# Patient Record
Sex: Male | Born: 1970 | Race: White | Hispanic: No | Marital: Single | Smoking: Never smoker
Health system: Southern US, Community
[De-identification: ages and names within clinical notes are randomized; demographics above are authoritative.]

---

## 2004-09-25 ENCOUNTER — Emergency Department: Payer: Self-pay | Admitting: Emergency Medicine

## 2007-09-24 ENCOUNTER — Emergency Department: Payer: Self-pay | Admitting: Emergency Medicine

## 2008-09-23 ENCOUNTER — Emergency Department: Payer: Self-pay | Admitting: Unknown Physician Specialty

## 2008-11-14 ENCOUNTER — Emergency Department: Payer: Self-pay | Admitting: Internal Medicine

## 2009-12-13 ENCOUNTER — Emergency Department (HOSPITAL_COMMUNITY): Admission: EM | Admit: 2009-12-13 | Discharge: 2009-12-13 | Payer: Self-pay | Admitting: Emergency Medicine

## 2010-01-20 ENCOUNTER — Emergency Department: Payer: Self-pay | Admitting: Emergency Medicine

## 2012-08-05 IMAGING — CT CT MAXILLOFACIAL W/O CM
3 series · 16 of 47 positions shown, 19 images · non-contrast
Comparison: None.

CLINICAL DATA: Sinus pressure and headache.

CT MAXILLOFACIAL WITHOUT CONTRAST
TECHNIQUE: Multidetector CT imaging of the maxillofacial
structures was performed. Multiplanar CT image reconstructions were
also generated.

[Series 2: facial bones · axial · 0.35mm/px · z∈[+118,+264]mm · 10 of 85 slices shown, 13 images]
[im 6/85  brain]
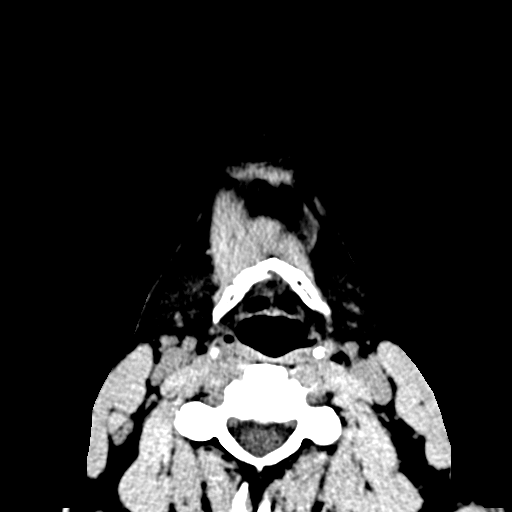
[im 6/85  bone]
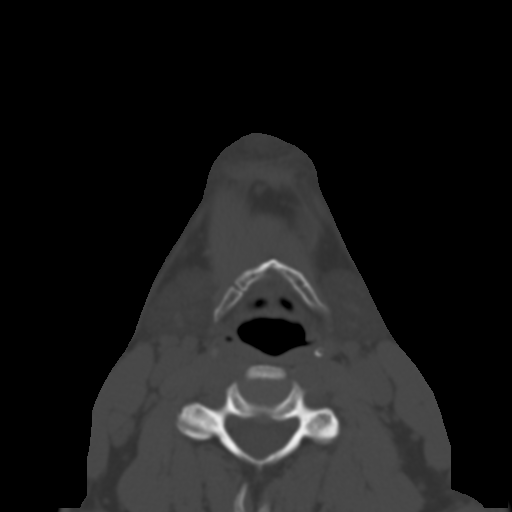
[im 15/85  bone]
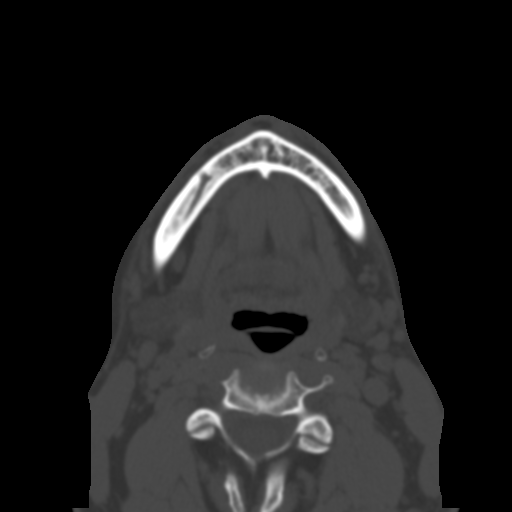
[im 24/85  bone]
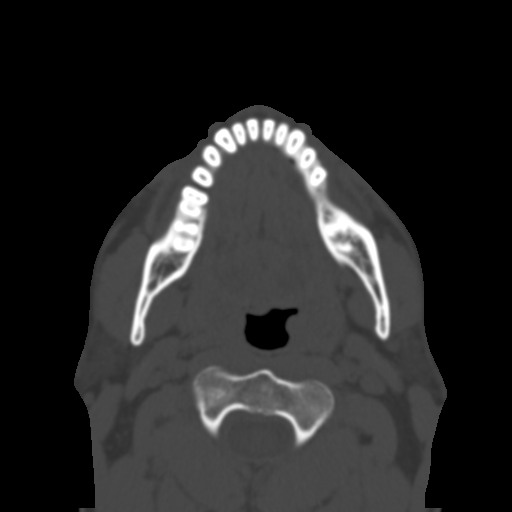
[im 29/85  bone]
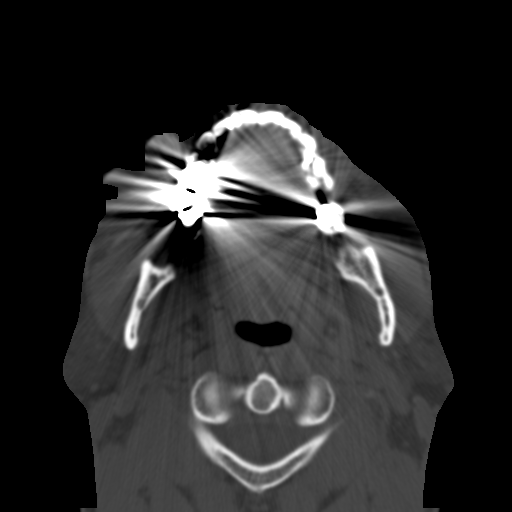
[im 38/85  brain]
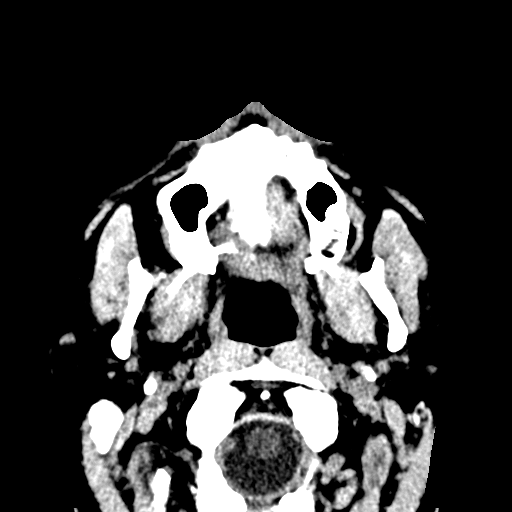
[im 38/85  bone]
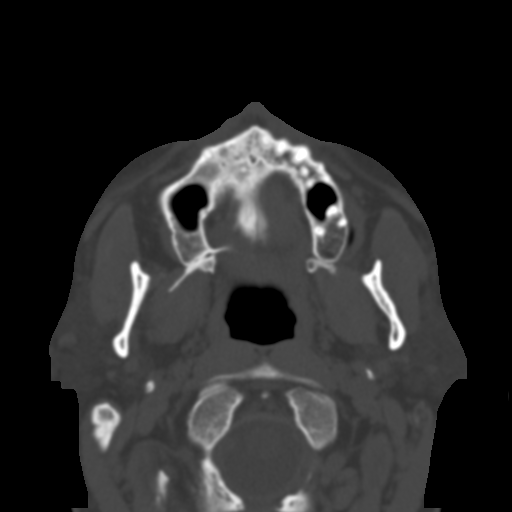
[im 47/85  bone]
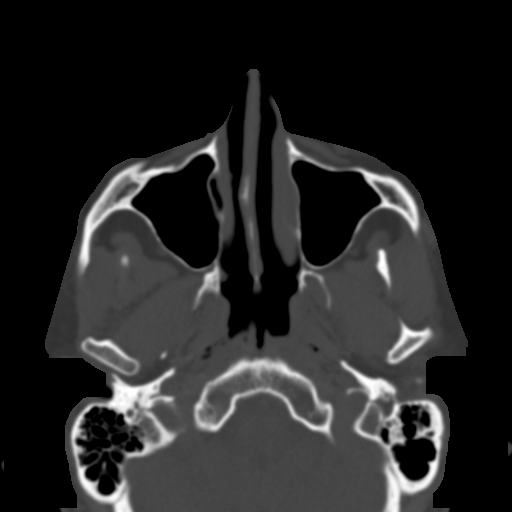
[im 56/85  bone]
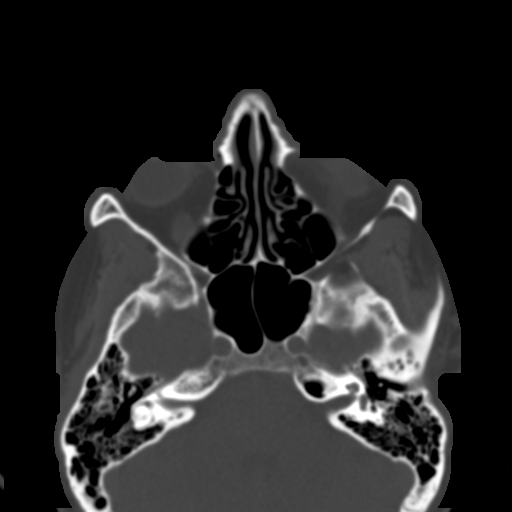
[im 64/85  bone]
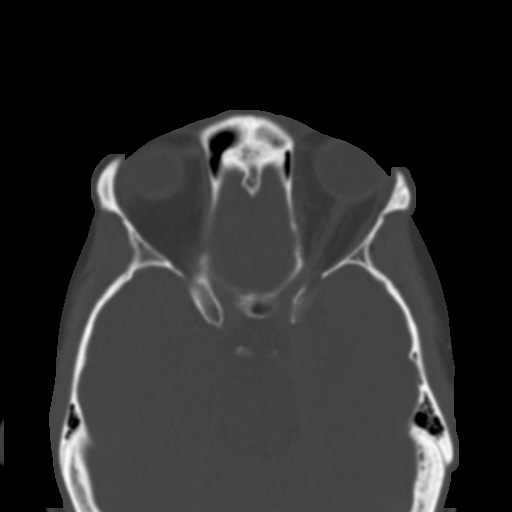
[im 70/85  brain]
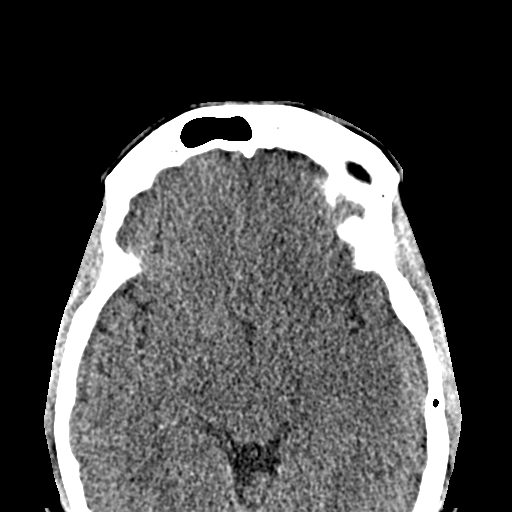
[im 70/85  bone]
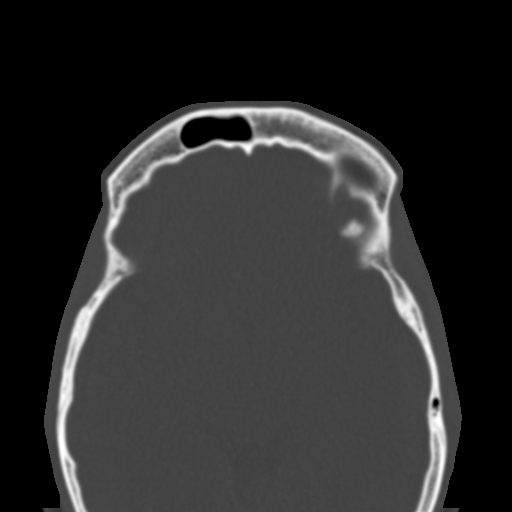
[im 79/85  bone]
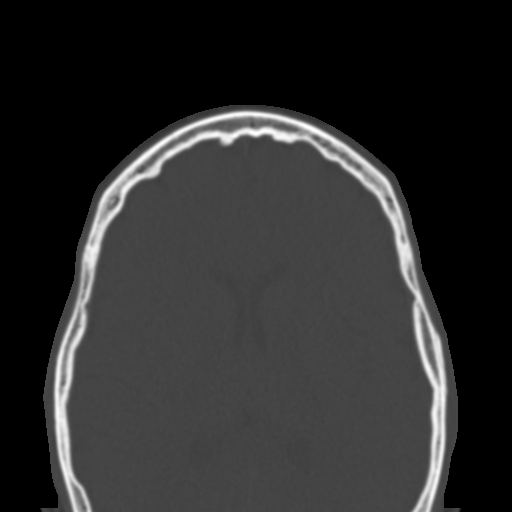

[Series 8048: mpr, coronal std, coronal · coronal · 0.35mm/px · 3 of 74 slices shown]
[im 25/74  bone]
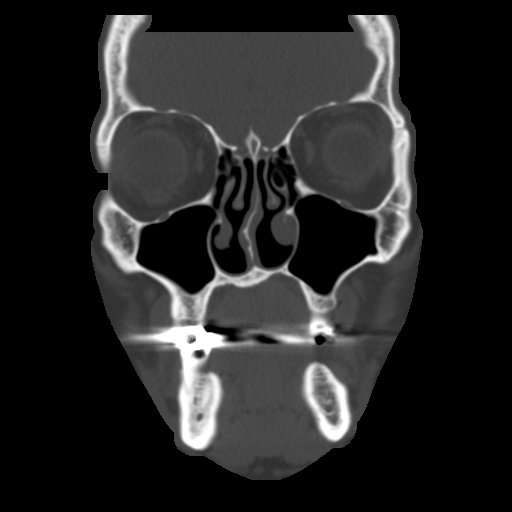
[im 33/74  bone]
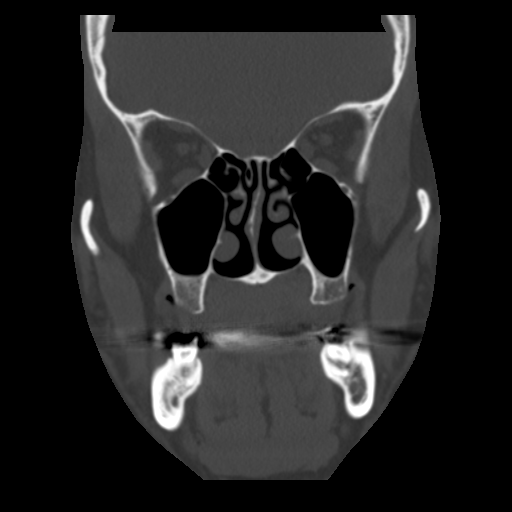
[im 41/74  bone]
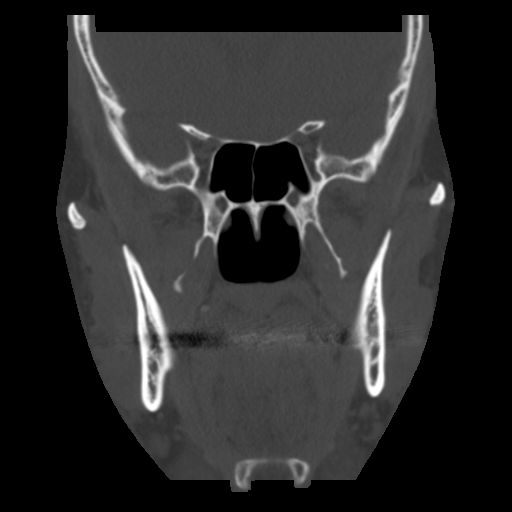

[Series 8049: mpr, sagittal std, sagittal · sagittal · 0.35mm/px · 3 of 73 slices shown]
[im 25/73  bone]
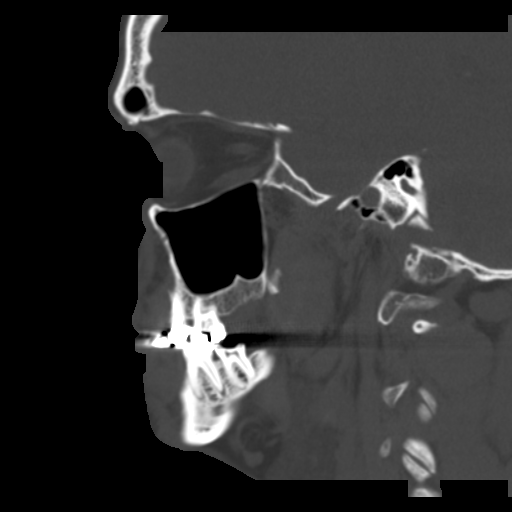
[im 37/73  bone]
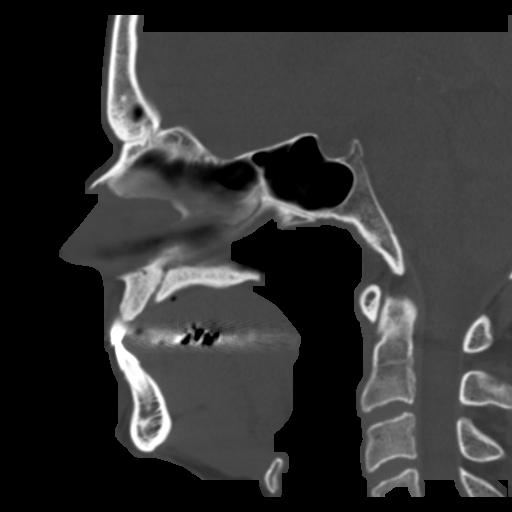
[im 49/73  bone]
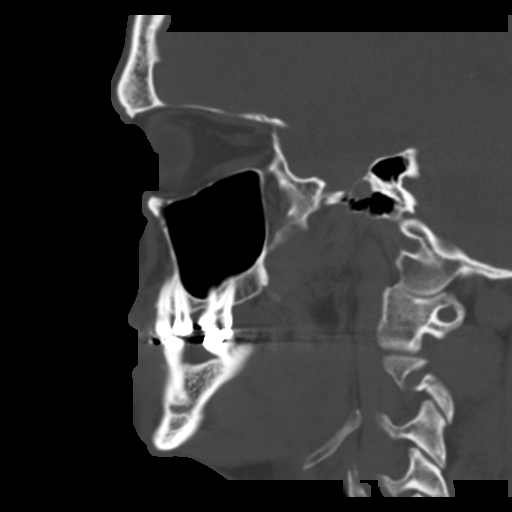

[16 of 47 positions shown; findings below may reference images not displayed]

FINDINGS: Normal appearance of the globes and orbits.  No gross
abnormality to the upper neck soft tissues.  There is no
significant upper neck lymphadenopathy.  Normal appearance of the
visualized intracranial structures.  There is a tiny amount of
fluid in the dependent right mastoid air cells.  The pterygoid
plates are intact.  Both mandibular condyles are located.  There is
periapical lucency surrounding the right upper lateral incisor.
There also lucency along the anterior aspect of the right upper
first bicuspid. Patient has multiple dental fillings.  The
paranasal sinuses are clear without any significant mucosal
thickening.  Negative for acute fracture.
IMPRESSION: Periapical lucencies involving the right upper lateral incisor and
right upper first bicuspid.  Findings are concerning for periapical
abscesses.

Normal appearance of the paranasal sinuses.

## 2016-01-28 ENCOUNTER — Emergency Department
Admission: EM | Admit: 2016-01-28 | Discharge: 2016-01-28 | Disposition: A | Discharge status: Home / Self Care | Payer: Self-pay | Attending: Emergency Medicine | Admitting: Emergency Medicine

## 2016-01-28 ENCOUNTER — Emergency Department: Payer: Self-pay

## 2016-01-28 ENCOUNTER — Encounter: Payer: Self-pay | Admitting: *Deleted

## 2016-01-28 DIAGNOSIS — N23 Unspecified renal colic: Secondary | ICD-10-CM | POA: Insufficient documentation

## 2016-01-28 DIAGNOSIS — R1031 Right lower quadrant pain: Secondary | ICD-10-CM

## 2016-01-28 LAB — URINALYSIS COMPLETE WITH MICROSCOPIC (ARMC ONLY)
BACTERIA UA: NONE SEEN
Bilirubin Urine: NEGATIVE
GLUCOSE, UA: NEGATIVE mg/dL
Ketones, ur: NEGATIVE mg/dL
Nitrite: NEGATIVE
PROTEIN: NEGATIVE mg/dL
SQUAMOUS EPITHELIAL / LPF: NONE SEEN
Specific Gravity, Urine: 1.017 (ref 1.005–1.030)
pH: 5 (ref 5.0–8.0)

## 2016-01-28 LAB — CBC WITH DIFFERENTIAL/PLATELET
BASOS ABS: 0.1 10*3/uL (ref 0–0.1)
BASOS PCT: 1 %
Eosinophils Absolute: 0.6 10*3/uL (ref 0–0.7)
Eosinophils Relative: 7 %
HEMATOCRIT: 46.7 % (ref 40.0–52.0)
HEMOGLOBIN: 15.9 g/dL (ref 13.0–18.0)
Lymphocytes Relative: 33 %
Lymphs Abs: 2.9 10*3/uL (ref 1.0–3.6)
MCH: 30.2 pg (ref 26.0–34.0)
MCHC: 34.1 g/dL (ref 32.0–36.0)
MCV: 88.8 fL (ref 80.0–100.0)
MONOS PCT: 7 %
Monocytes Absolute: 0.6 10*3/uL (ref 0.2–1.0)
NEUTROS ABS: 4.5 10*3/uL (ref 1.4–6.5)
NEUTROS PCT: 52 %
Platelets: 367 10*3/uL (ref 150–440)
RBC: 5.26 MIL/uL (ref 4.40–5.90)
RDW: 12.7 % (ref 11.5–14.5)
WBC: 8.7 10*3/uL (ref 3.8–10.6)

## 2016-01-28 LAB — COMPREHENSIVE METABOLIC PANEL
ALBUMIN: 4.6 g/dL (ref 3.5–5.0)
ALK PHOS: 61 U/L (ref 38–126)
ALT: 25 U/L (ref 17–63)
AST: 23 U/L (ref 15–41)
Anion gap: 9 (ref 5–15)
BILIRUBIN TOTAL: 0.4 mg/dL (ref 0.3–1.2)
BUN: 15 mg/dL (ref 6–20)
CALCIUM: 9.6 mg/dL (ref 8.9–10.3)
CO2: 26 mmol/L (ref 22–32)
CREATININE: 1.18 mg/dL (ref 0.61–1.24)
Chloride: 104 mmol/L (ref 101–111)
GFR calc Af Amer: >60 mL/min (ref >60)
GFR calc non Af Amer: >60 mL/min (ref >60)
GLUCOSE: 122 mg/dL — AB (ref 65–99)
Potassium: 3.9 mmol/L (ref 3.5–5.1)
Sodium: 139 mmol/L (ref 135–145)
TOTAL PROTEIN: 8 g/dL (ref 6.5–8.1)

## 2016-01-28 LAB — LIPASE, BLOOD: Lipase: 26 U/L (ref 11–51)

## 2016-01-28 MED ORDER — HYDROMORPHONE HCL 1 MG/ML IJ SOLN
1.0000 mg | Freq: Once | INTRAMUSCULAR | Status: AC
  Administered 2016-01-28: 1 mg via INTRAVENOUS
  Filled 2016-01-28: qty 1

## 2016-01-28 MED ORDER — ONDANSETRON HCL 4 MG PO TABS: 4.0000 mg | ORAL_TABLET | Freq: Three times a day (TID) | ORAL | 0 refills | Status: DC | PRN

## 2016-01-28 MED ORDER — SODIUM CHLORIDE 0.9 % IV BOLUS (SEPSIS)
500.0000 mL | Freq: Once | INTRAVENOUS | Status: AC
  Administered 2016-01-28: 500 mL via INTRAVENOUS

## 2016-01-28 MED ORDER — ONDANSETRON HCL 4 MG/2ML IJ SOLN
4.0000 mg | Freq: Once | INTRAMUSCULAR | Status: AC
  Administered 2016-01-28: 4 mg via INTRAVENOUS
  Filled 2016-01-28: qty 2

## 2016-01-28 MED ORDER — KETOROLAC TROMETHAMINE 30 MG/ML IJ SOLN
30.0000 mg | Freq: Once | INTRAMUSCULAR | Status: AC
  Administered 2016-01-28: 30 mg via INTRAVENOUS
  Filled 2016-01-28: qty 1

## 2016-01-28 NOTE — ED Notes (Signed)
Pt transported to CT ?

## 2016-01-28 NOTE — ED Triage Notes (Signed)
Patient c/o RLQ approximately 1.5 hours prior to arrival. Patient states pain is sharp, at times radiates to right flank. Patient states pain intermittently increases in intensity. Patient got out of the wheelchair in the lobby and laid on the floor, but was able to get back in the wheelchair independently.

## 2016-01-28 NOTE — Discharge Instructions (Signed)
Please return immediately if condition worsens. Please contact her primary physician or the physician you were given for referral. If you have any specialist physicians involved in her treatment and plan please also contact them. Thank you for using [facility]. ° °

## 2016-01-28 NOTE — ED Provider Notes (Signed)
Time Seen: Approximately 1134  I have reviewed the triage notes  Chief Complaint: Abdominal Pain   History of Present Illness: Eddie Martin is a 45 y.o. male who presents with sudden onset of right-sided lower abdominal pain. Patient states he was nauseated with no active vomiting at this time. He states pain started suddenly an hour and a half prior to arrival. He denies any dysuria, hematuria or urinary frequency. Denies any constipation or diarrhea. Patient denies any upper abdominal pain is not aware of any fever at home.   No past medical history on file.  There are no active problems to display for this patient.   No past surgical history on file.  No past surgical history on file.    Allergies:  Patient has no allergy information on record.  Family History: No family history on file.  Social History: Social History  Substance Use Topics  . Smoking status: Not on file  . Smokeless tobacco: Not on file  . Alcohol use Not on file     Review of Systems:   10 point review of systems was performed and was otherwise negative:  Constitutional: No fever Eyes: No visual disturbances ENT: No sore throat, ear pain Cardiac: No chest pain Respiratory: No shortness of breath, wheezing, or stridor Abdomen: Patient points primarily to the right lower quadrant states pain will occasionally radiate to the middle lower left side of his abdomen. Endocrine: No weight loss, No night sweats Extremities: No peripheral edema, cyanosis Skin: No rashes, easy bruising Neurologic: No focal weakness, trouble with speech or swollowing Urologic: No dysuria, Hematuria, or urinary frequency *Denies any testicular pain or masses.  Physical Exam:  ED Triage Vitals [01/28/16 1134]  Enc Vitals Group     BP (!) 143/97     Pulse Rate 62     Resp 20     Temp      Temp src      SpO2 100 %     Weight      Height      Head Circumference      Peak Flow      Pain Score      Pain  Loc      Pain Edu?      Excl. in GC?     General: Awake , Alert , and Oriented times 3; GCS 15 Head: Normal cephalic , atraumatic Eyes: Pupils equal , round, reactive to light Nose/Throat: No nasal drainage, patent upper airway without erythema or exudate.  Neck: Supple, Full range of motion, No anterior adenopathy or palpable thyroid masses Lungs: Clear to ascultation without wheezes , rhonchi, or rales Heart: Regular rate, regular rhythm without murmurs , gallops , or rubs Abdomen: Tender to palpation right lower quadrant. Bowel sounds are positive and symmetric in all 4 quadrants with no distention.      Extremities: 2 plus symmetric pulses. No edema, clubbing or cyanosis Neurologic: normal ambulation, Motor symmetric without deficits, sensory intact Skin: warm, dry, no rashes   Labs:   All laboratory work was reviewed including any pertinent negatives or positives listed below:  Labs Reviewed  CBC WITH DIFFERENTIAL/PLATELET  COMPREHENSIVE METABOLIC PANEL  LIPASE, BLOOD  URINALYSIS COMPLETEWITH MICROSCOPIC ([facility] ONLY)  Laboratory work was reviewed and showed no clinically significant abnormalities.  Radiology:  "Ct Renal Stone Study  Result Date: 01/28/2016 CLINICAL DATA:  Acute right flank, right lower quadrant pain starting 11: 30 this morning EXAM: CT ABDOMEN AND PELVIS WITHOUT CONTRAST TECHNIQUE:  Multidetector CT imaging of the abdomen and pelvis was performed following the standard protocol without IV contrast. COMPARISON:  None. FINDINGS: Lower chest: Streaky atelectasis noted in right lower lobe posteriorly. Hepatobiliary: Unenhanced liver is normal. No calcified gallstones are noted within gallbladder. Pancreas: Unenhanced pancreas is normal. Spleen: Unenhanced spleen is normal. Adrenals/Urinary Tract: No adrenal gland mass. No nephrolithiasis. No calcified ureteral calculi are noted. Axial image 83 there is 2 mm calcified calculus in right posterior aspect of the urinary  bladder. Stomach/Bowel: No small bowel obstruction. No pericecal inflammation. Normal appendix is noted in axial image 70. No distal colitis or diverticulitis. Vascular/Lymphatic: No aortic aneurysm.  No adenopathy. Reproductive: Prostate gland and seminal vesicles are unremarkable. Other: No ascites or free abdominal air. Bilateral inguinal scrotal canal small hernia containing fat without evidence of acute complication. Musculoskeletal: No destructive bony lesions are noted. Sagittal images of the spine shows disc space flattening at L5-S1 level. IMPRESSION: 1. There is no nephrolithiasis. No calcified ureteral calculi are noted. 2. No hydronephrosis or hydroureter. 3. There is 2 mm calcified calculus in right posterior aspect of urinary bladder. 4. No pericecal inflammation.  Normal appendix. 5. No small bowel obstruction. Electronically Signed   By: Natasha MeadLiviu  Pop M.D.   On: 01/28/2016 13:17  "   I personally reviewed the radiologic studies    ED Course:  Differential diagnosis includes but is not exclusive to acute appendicitis, renal colic, testicular torsion, urinary tract infection, prostatitis,  diverticulitis, small bowel obstruction, colitis, abdominal aortic aneurysm, gastroenteritis, etc.  Given the patient's current clinical presentation and objective findings I felt most likely this was a recently passed kidney stone. Patient's comfortable after IV Toradol, IV Dilaudid, and IV Zofran. He has no discomfort. There is no signs of urinary tract infection or renal infarct at this time. Patient will be discharged on over-the-counter ibuprofen and Zofran as needed for nausea and vomiting. Clinical Course      Assessment: * Acute renal colic   Final Clinical Impression:   Final diagnoses:  Right lower quadrant abdominal pain     Plan: * Outpatient " Discharge Medication List as of 01/28/2016  2:25 PM    START taking these medications   Details  ondansetron (ZOFRAN) 4 MG tablet  Take 1 tablet (4 mg total) by mouth every 8 (eight) hours as needed for nausea or vomiting., Starting Sat 01/28/2016, Print      " Patient was advised to return immediately if condition worsens. Patient was advised to follow up with their primary care physician or other specialized physicians involved in their outpatient care. The patient and/or family member/power of attorney had laboratory results reviewed at the bedside. All questions and concerns were addressed and appropriate discharge instructions were distributed by the nursing staff.             Jennye MoccasinBrian S Tresia Revolorio, MD 01/28/16 [id number]

## 2019-06-06 ENCOUNTER — Ambulatory Visit: Payer: Self-pay | Attending: Internal Medicine

## 2019-06-06 DIAGNOSIS — Z23 Encounter for immunization: Secondary | ICD-10-CM

## 2019-06-06 NOTE — Progress Notes (Signed)
   Covid-19 Vaccination Clinic  Name:  GOLDEN GILREATH    MRN: [id number] DOB: 1970-03-24  06/06/2019  Mr. Tomasello was observed post Covid-19 immunization for 15 minutes without incident. He was provided with Vaccine Information Sheet and instruction to access the V-Safe system.   Mr. Larouche was instructed to call 911 with any severe reactions post vaccine: Marland Kitchen Difficulty breathing  . Swelling of face and throat  . A fast heartbeat  . A bad rash all over body  . Dizziness and weakness   Immunizations Administered    Name Date Dose VIS Date Route   Pfizer COVID-19 Vaccine 06/06/2019 10:01 AM 0.3 mL 02/13/2019 Intramuscular   Manufacturer: [employer], [employer]   Lot: [id number]   NDC: [address]-9

## 2019-07-01 ENCOUNTER — Ambulatory Visit: Payer: Self-pay | Attending: Internal Medicine

## 2019-07-01 DIAGNOSIS — Z23 Encounter for immunization: Secondary | ICD-10-CM

## 2019-07-01 NOTE — Progress Notes (Signed)
   Covid-19 Vaccination Clinic  Name:  Eddie Martin    MRN: [id number] DOB: [birth year]  07/01/2019  Mr. Raulston was observed post Covid-19 immunization for 15 minutes without incident. He was provided with Vaccine Information Sheet and instruction to access the V-Safe system.   Mr. Guay was instructed to call 911 with any severe reactions post vaccine: Marland Kitchen Difficulty breathing  . Swelling of face and throat  . A fast heartbeat  . A bad rash all over body  . Dizziness and weakness   Immunizations Administered    Name Date Dose VIS Date Route   Pfizer COVID-19 Vaccine 07/01/2019  8:15 AM 0.3 mL 04/29/2018 Intramuscular   Manufacturer: [employer], [employer]   Lot: [id number]   NDC: [address]-8

## 2019-12-23 ENCOUNTER — Encounter: Payer: Self-pay | Admitting: Surgery

## 2019-12-23 ENCOUNTER — Other Ambulatory Visit: Payer: Self-pay

## 2019-12-23 ENCOUNTER — Ambulatory Visit (INDEPENDENT_AMBULATORY_CARE_PROVIDER_SITE_OTHER): Payer: PRIVATE HEALTH INSURANCE | Admitting: Surgery

## 2019-12-23 VITALS — BP 117/79 | HR 86 | Temp 97.9°F | Resp 12 | Ht 69.0 in | Wt 198.0 lb

## 2019-12-23 DIAGNOSIS — R109 Unspecified abdominal pain: Secondary | ICD-10-CM

## 2019-12-23 NOTE — Patient Instructions (Signed)
CT Scheduled for 01/01/20 @ 12:45 pm at [facility]     Please go to the Lab today. Please enter through the [facility] entrance.  Please pick up your prep kit today at the Radiology department. Nothing to eat/drink 4 hours prior.   Please see your follow up appointment listed below.

## 2019-12-25 NOTE — Progress Notes (Signed)
Patient ID: Eddie Martin, male   DOB: 1970/12/29, 49 y.o.   MRN: [id number]  HPI Eddie Martin is a 49 y.o. male seen in consultation at the request of Mrs. Aundria Rud NP for significant episode of reflux and intractable hiccups.  He was seen at outside hospital ER about 10 days ago for intractable hiccuping and reflux.  At that time a CT scan was performed and I have personally reviewed the report showing a small hiatal hernia and no other acute intra-abdominal abnormalities.  Reports now that his symptoms have resolved. At that time he also had some abdominal discomfort that was mild to moderate intensity and significant reflux symptoms.  He was given a GI cocktail PPI and Reglan with significant improvement.  He denies any prior abdominal operation.  He denies any abdominal pain no fevers no chills.  He is tolerating diet. Have personally reviewed the CT scan in 2017 showing evidence of lateral inguinal hernias without any other acute intra-abdominal abnormalities.  Patient specifically denies any symptoms associated to his hernias.  At That time also he had a CBC and CMP that were completely normal  HPI  History reviewed. No pertinent past medical history.  History reviewed. No pertinent surgical history.  Family History  Problem Relation Age of Onset  . Lung cancer Mother   . Heart disease Father     Social History Social History   Tobacco Use  . Smoking status: Never Smoker  . Smokeless tobacco: Never Used  Substance Use Topics  . Alcohol use: Yes    Comment: occasional  . Drug use: No    No Known Allergies  No current outpatient medications on file.   No current facility-administered medications for this visit.     Review of Systems Full ROS  was asked and was negative except for the information on the HPI  Physical Exam Blood pressure 117/79, pulse 86, temperature 97.9 F (36.6 C), temperature source Oral, resp. rate 12, height 5\' 9"  (1.753 m), weight 198 lb  (89.8 kg), SpO2 95 %. CONSTITUTIONAL: NAD EYES: Pupils are equal, round, , Sclera are non-icteric. EARS, NOSE, MOUTH AND THROAT: He is wearing a mask hearing is intact to voice. LYMPH NODES:  Lymph nodes in the neck are normal. RESPIRATORY:  Lungs are clear. There is normal respiratory effort, with equal breath sounds bilaterally, and without pathologic use of accessory muscles. CARDIOVASCULAR: Heart is regular without murmurs, gallops, or rubs. GI: The abdomen is  soft, nontender, and nondistended. There are no palpable masses. There is no hepatosplenomegaly. There are normal bowel sounds in all quadrants. GU: Rectal deferred.   MUSCULOSKELETAL: Normal muscle strength and tone. No cyanosis or edema.   SKIN: Turgor is good and there are no pathologic skin lesions or ulcers. NEUROLOGIC: Motor and sensation is grossly normal. Cranial nerves are grossly intact. PSYCH:  Oriented to person, place and time. Affect is normal.  Data Reviewed  I have personally reviewed the patient's imaging, laboratory findings and medical records.    Assessment/Plan 49 year old with episode of intractable hiccups that lasted for 10 days or so with some reflux symptoms.  Currently his symptoms have completely resolved.  Cussed with the patient in detail about options of watchful waiting versus repeating another imaging studies to assess for his hiatal hernia and a potential cause of hiccuping and GI symptoms.  He wishes to pursue further work-up.  We will obtain a CT scan of the abdomen pelvis as well as basic labs CBC and CMP  and reassess him in a couple weeks.  Currently there is no need for emergent surgical intervention.  A copy of this report was sent to the referring providers  Sterling Big, MD FACS General Surgeon 12/25/2019, 12:31 PM

## 2020-01-01 ENCOUNTER — Ambulatory Visit: Admission: RE | Admit: 2020-01-01 | Payer: PRIVATE HEALTH INSURANCE | Source: Ambulatory Visit

## 2020-01-06 ENCOUNTER — Ambulatory Visit: Payer: Self-pay | Admitting: Surgery
